# Patient Record
Sex: Female | Born: 1935 | Race: White | Hispanic: No | Marital: Single | Smoking: Never smoker
Health system: Southern US, Community
[De-identification: ages and names within clinical notes are randomized; demographics above are authoritative.]

## PROBLEM LIST (undated history)

## (undated) DIAGNOSIS — S72009A Fracture of unspecified part of neck of unspecified femur, initial encounter for closed fracture: Secondary | ICD-10-CM

## (undated) DIAGNOSIS — K635 Polyp of colon: Secondary | ICD-10-CM

## (undated) DIAGNOSIS — R7301 Impaired fasting glucose: Secondary | ICD-10-CM

## (undated) DIAGNOSIS — K8020 Calculus of gallbladder without cholecystitis without obstruction: Secondary | ICD-10-CM

## (undated) DIAGNOSIS — H409 Unspecified glaucoma: Secondary | ICD-10-CM

## (undated) DIAGNOSIS — K449 Diaphragmatic hernia without obstruction or gangrene: Secondary | ICD-10-CM

## (undated) DIAGNOSIS — E785 Hyperlipidemia, unspecified: Secondary | ICD-10-CM

## (undated) DIAGNOSIS — D649 Anemia, unspecified: Secondary | ICD-10-CM

## (undated) HISTORY — DX: Unspecified glaucoma: H40.9

## (undated) HISTORY — DX: Anemia, unspecified: D64.9

## (undated) HISTORY — PX: ABDOMINAL HYSTERECTOMY: SHX81

## (undated) HISTORY — DX: Polyp of colon: K63.5

## (undated) HISTORY — PX: CHOLECYSTECTOMY: SHX55

## (undated) HISTORY — DX: Calculus of gallbladder without cholecystitis without obstruction: K80.20

## (undated) HISTORY — DX: Impaired fasting glucose: R73.01

## (undated) HISTORY — DX: Fracture of unspecified part of neck of unspecified femur, initial encounter for closed fracture: S72.009A

## (undated) HISTORY — PX: JOINT REPLACEMENT: SHX530

## (undated) HISTORY — DX: Diaphragmatic hernia without obstruction or gangrene: K44.9

## (undated) HISTORY — DX: Hyperlipidemia, unspecified: E78.5

---

## 2007-05-27 LAB — HM COLONOSCOPY

## 2008-07-09 LAB — HM PAP SMEAR

## 2009-07-09 ENCOUNTER — Encounter (INDEPENDENT_AMBULATORY_CARE_PROVIDER_SITE_OTHER): Payer: Self-pay | Admitting: Ophthalmology

## 2009-07-09 ENCOUNTER — Observation Stay (HOSPITAL_COMMUNITY): Admission: RE | Admit: 2009-07-09 | Discharge: 2009-07-10 | Payer: Self-pay | Admitting: Ophthalmology

## 2010-04-14 LAB — CBC
HCT: 40.3 % (ref 36.0–46.0)
Platelets: 213 10*3/uL (ref 150–400)
RDW: 12.6 % (ref 11.5–15.5)

## 2011-07-09 LAB — HM MAMMOGRAPHY

## 2011-11-30 IMAGING — CR DG CHEST 2V
2 series · 2 of 2 positions shown · non-contrast
Comparison: None.

CLINICAL DATA: Dislocated IOL right eye.  Preop workup.

CHEST - 2 VIEW

[view not recorded (1 of 2)]
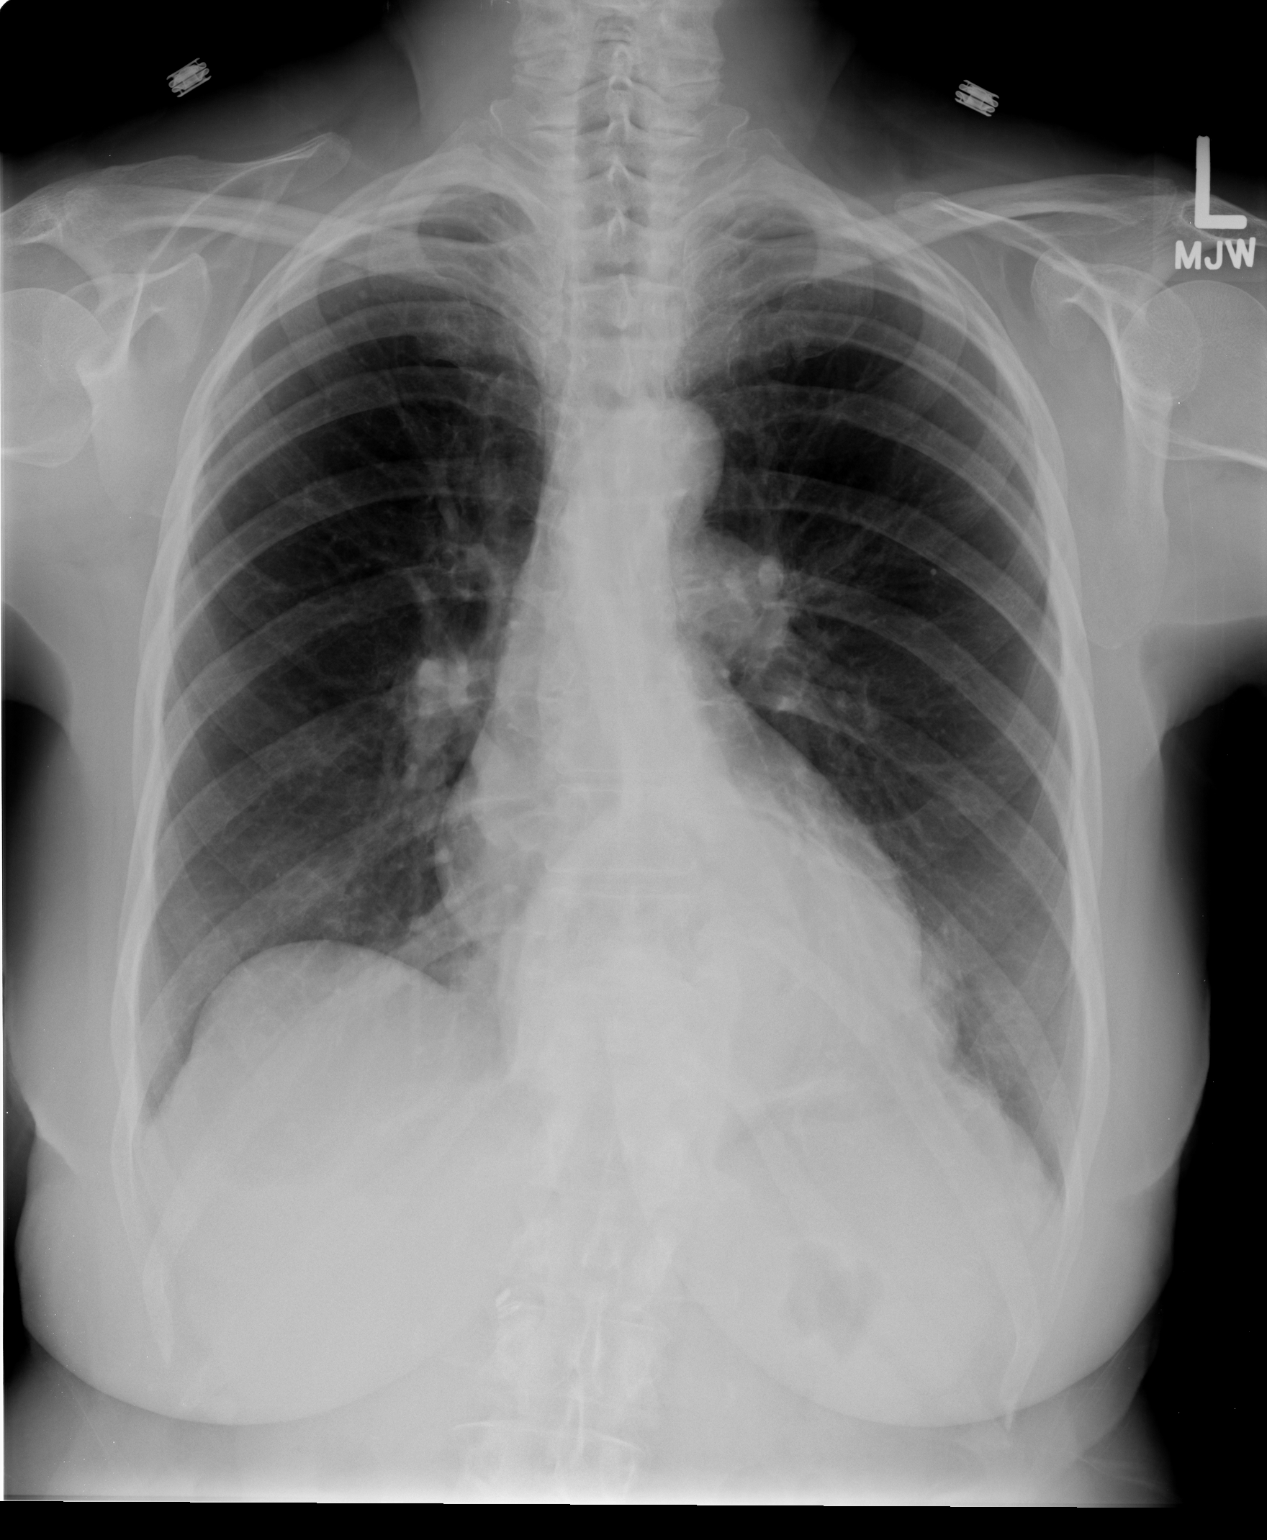

[view not recorded (2 of 2)]
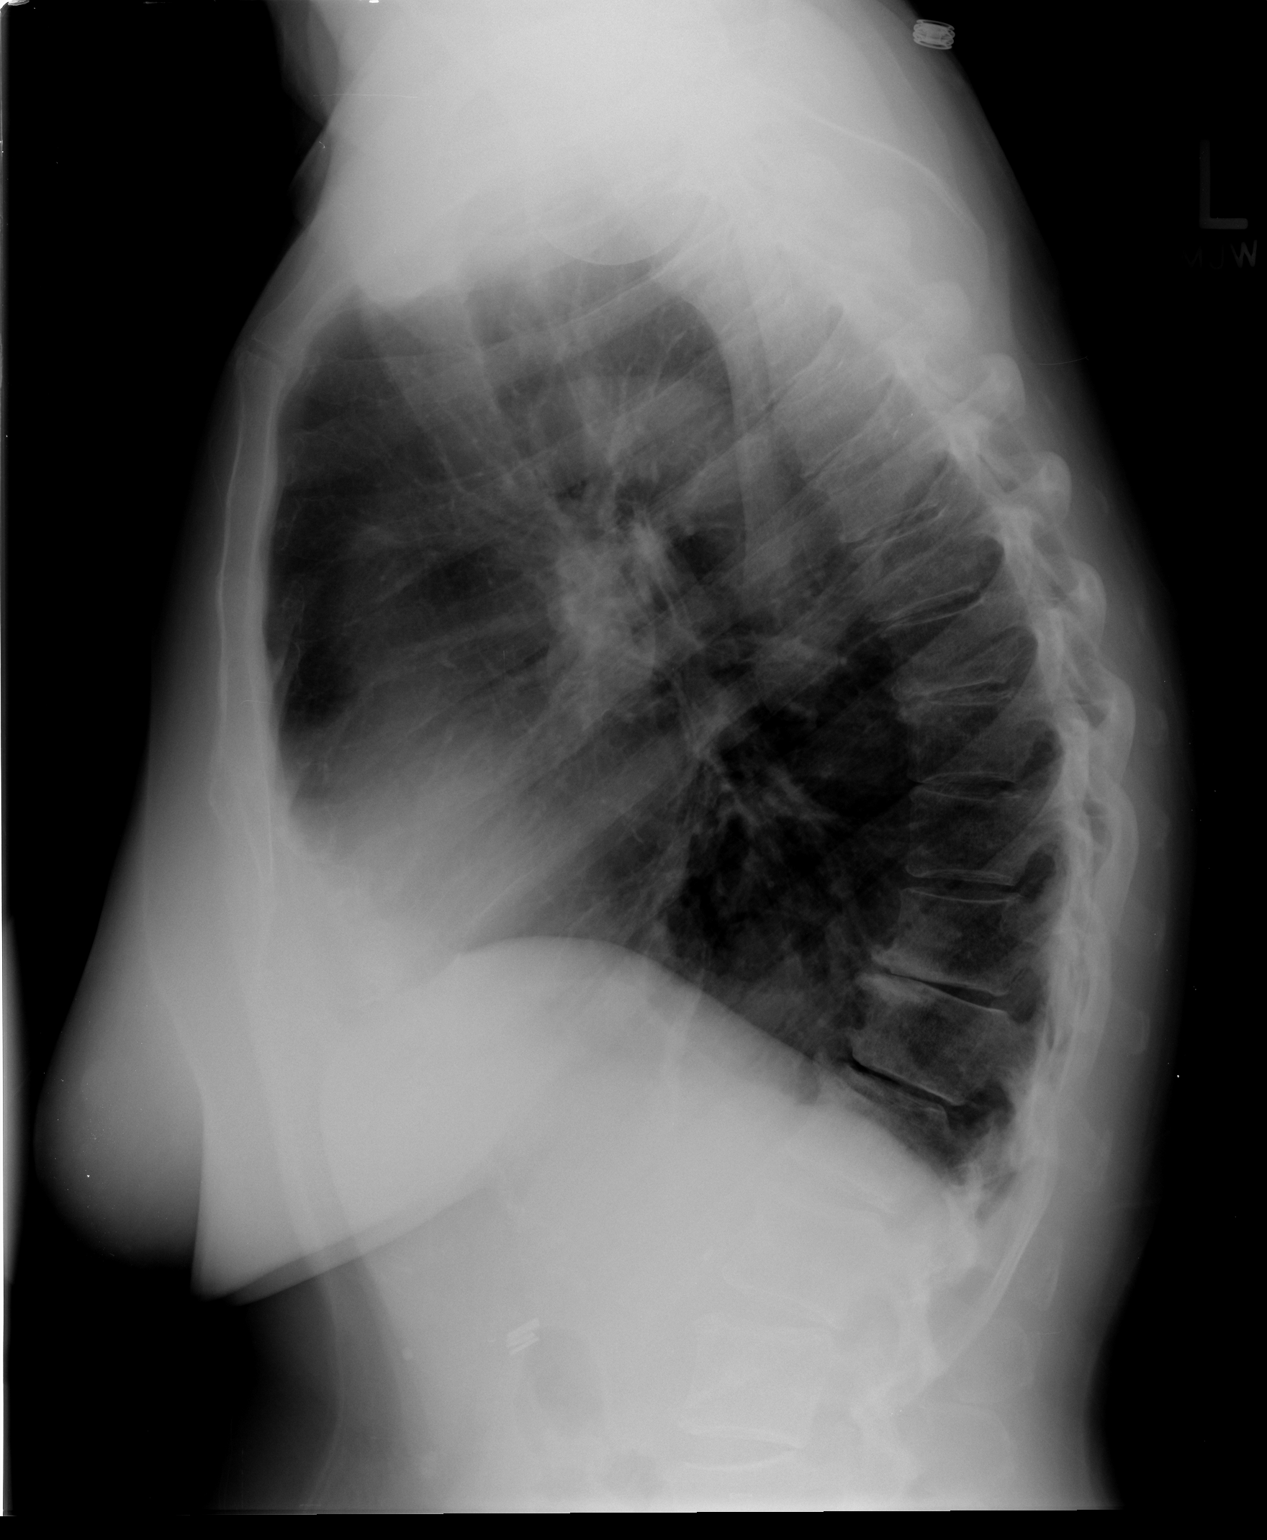

[2 of 2 positions shown; findings below may reference images not displayed]

FINDINGS: Normal heart size.  Hiatal hernia.  Lungs clear of an
active process.  Spondylosis of the lower dorsal spine
IMPRESSION: No acute chest findings.  Hiatal hernia.

## 2012-05-26 ENCOUNTER — Other Ambulatory Visit: Payer: Self-pay | Admitting: Family Medicine

## 2012-05-26 ENCOUNTER — Other Ambulatory Visit: Payer: Medicare Other

## 2012-05-26 LAB — T4, FREE: Free T4: 1.03 ng/dL (ref 0.80–1.80)

## 2012-05-26 LAB — COMPLETE METABOLIC PANEL WITH GFR
ALT: 18 U/L (ref 0–35)
AST: 20 U/L (ref 0–37)
Albumin: 4.3 g/dL (ref 3.5–5.2)
Alkaline Phosphatase: 74 U/L (ref 39–117)
BUN: 24 mg/dL — ABNORMAL HIGH (ref 6–23)
CO2: 23 mEq/L (ref 19–32)
Calcium: 9.8 mg/dL (ref 8.4–10.5)
Chloride: 103 mEq/L (ref 96–112)
Creat: 0.87 mg/dL (ref 0.50–1.10)
GFR, Est African American: 72 mL/min
GFR, Est Non African American: 62 mL/min
Glucose, Bld: 108 mg/dL — ABNORMAL HIGH (ref 70–99)
Potassium: 4.5 mEq/L (ref 3.5–5.3)
Sodium: 140 mEq/L (ref 135–145)
Total Bilirubin: 0.8 mg/dL (ref 0.3–1.2)
Total Protein: 6.4 g/dL (ref 6.0–8.3)

## 2012-05-26 LAB — HEMOGLOBIN A1C
Hgb A1c MFr Bld: 5.6 % (ref <5.7)
Mean Plasma Glucose: 114 mg/dL (ref <117)

## 2012-05-26 LAB — TSH: TSH: 9.933 u[IU]/mL — ABNORMAL HIGH (ref 0.350–4.500)

## 2012-05-27 LAB — MICROALBUMIN, URINE: Microalb, Ur: 0.63 mg/dL (ref 0.00–1.89)

## 2012-06-03 ENCOUNTER — Ambulatory Visit (INDEPENDENT_AMBULATORY_CARE_PROVIDER_SITE_OTHER): Payer: Medicare Other | Admitting: Family Medicine

## 2012-06-03 ENCOUNTER — Encounter: Payer: Self-pay | Admitting: Family Medicine

## 2012-06-03 VITALS — BP 107/70 | HR 82 | Ht 61.0 in | Wt 153.0 lb

## 2012-06-03 DIAGNOSIS — E785 Hyperlipidemia, unspecified: Secondary | ICD-10-CM

## 2012-06-03 DIAGNOSIS — R413 Other amnesia: Secondary | ICD-10-CM

## 2012-06-03 DIAGNOSIS — I10 Essential (primary) hypertension: Secondary | ICD-10-CM

## 2012-06-03 DIAGNOSIS — E039 Hypothyroidism, unspecified: Secondary | ICD-10-CM

## 2012-06-03 MED ORDER — VITAMIN D (ERGOCALCIFEROL) 1.25 MG (50000 UNIT) PO CAPS: 50000.0000 [IU] | ORAL_CAPSULE | ORAL | Status: AC

## 2012-06-03 MED ORDER — SIMVASTATIN 80 MG PO TABS: 80.0000 mg | ORAL_TABLET | Freq: Every day | ORAL | Status: DC

## 2012-06-03 MED ORDER — LOSARTAN POTASSIUM-HCTZ 100-12.5 MG PO TABS: 1.0000 | ORAL_TABLET | Freq: Every day | ORAL | Status: DC

## 2012-06-03 MED ORDER — FOLIC ACID 1 MG PO TABS: 1.0000 mg | ORAL_TABLET | Freq: Every day | ORAL | Status: AC

## 2012-06-03 MED ORDER — LEVOTHYROXINE SODIUM 75 MCG PO TABS: 75.0000 ug | ORAL_TABLET | Freq: Every day | ORAL | Status: DC

## 2012-06-03 NOTE — Patient Instructions (Addendum)
1)  Fatigue - Start on the levothyroxine 75 mcg every morning.  Start taking your Zyrtec at night.  If you need more help with allergies then try taking one Zyrtec D during the day. The "D" will counteract the sleepiness.  Cut your BP medication in 1/2.  If you aren't feeling less fatigued in 3 months, then we could try adding some Wellbutrin.   Fatigue Fatigue is a feeling of tiredness, lack of energy, lack of motivation, or feeling tired all the time. Having enough rest, good nutrition, and reducing stress will normally reduce fatigue. Consult your caregiver if it persists. The nature of your fatigue will help your caregiver to find out its cause. The treatment is based on the cause.  CAUSES  There are many causes for fatigue. Most of the time, fatigue can be traced to one or more of your habits or routines. Most causes fit into one or more of three general areas. They are: Lifestyle problems  Sleep disturbances.  Overwork.  Physical exertion.  Unhealthy habits.  Poor eating habits or eating disorders.  Alcohol and/or drug use .  Lack of proper nutrition (malnutrition). Psychological problems  Stress and/or anxiety problems.  Depression.  Grief.  Boredom. Medical Problems or Conditions  Anemia.  Pregnancy.  Thyroid gland problems.  Recovery from major surgery.  Continuous pain.  Emphysema or asthma that is not well controlled  Allergic conditions.  Diabetes.  Infections (such as mononucleosis).  Obesity.  Sleep disorders, such as sleep apnea.  Heart failure or other heart-related problems.  Cancer.  Kidney disease.  Liver disease.  Effects of certain medicines such as antihistamines, cough and cold remedies, prescription pain medicines, heart and blood pressure medicines, drugs used for treatment of cancer, and some antidepressants. SYMPTOMS  The symptoms of fatigue include:   Lack of energy.  Lack of drive (motivation).  Drowsiness.  Feeling  of indifference to the surroundings. DIAGNOSIS  The details of how you feel help guide your caregiver in finding out what is causing the fatigue. You will be asked about your present and past health condition. It is important to review all medicines that you take, including prescription and non-prescription items. A thorough exam will be done. You will be questioned about your feelings, habits, and normal lifestyle. Your caregiver may suggest blood tests, urine tests, or other tests to look for common medical causes of fatigue.  TREATMENT  Fatigue is treated by correcting the underlying cause. For example, if you have continuous pain or depression, treating these causes will improve how you feel. Similarly, adjusting the dose of certain medicines will help in reducing fatigue.  HOME CARE INSTRUCTIONS   Try to get the required amount of good sleep every night.  Eat a healthy and nutritious diet, and drink enough water throughout the day.  Practice ways of relaxing (including yoga or meditation).  Exercise regularly.  Make plans to change situations that cause stress. Act on those plans so that stresses decrease over time. Keep your work and personal routine reasonable.  Avoid street drugs and minimize use of alcohol.  Start taking a daily multivitamin after consulting your caregiver. SEEK MEDICAL CARE IF:   You have persistent tiredness, which cannot be accounted for.  You have fever.  You have unintentional weight loss.  You have headaches.  You have disturbed sleep throughout the night.  You are feeling sad.  You have constipation.  You have dry skin.  You have gained weight.  You are taking any  new or different medicines that you suspect are causing fatigue.  You are unable to sleep at night.  You develop any unusual swelling of your legs or other parts of your body. SEEK IMMEDIATE MEDICAL CARE IF:   You are feeling confused.  Your vision is blurred.  You feel  faint or pass out.  You develop severe headache.  You develop severe abdominal, pelvic, or back pain.  You develop chest pain, shortness of breath, or an irregular or fast heartbeat.  You are unable to pass a normal amount of urine.  You develop abnormal bleeding such as bleeding from the rectum or you vomit blood.  You have thoughts about harming yourself or committing suicide.  You are worried that you might harm someone else. MAKE SURE YOU:   Understand these instructions.  Will watch your condition.  Will get help right away if you are not doing well or get worse. Document Released: 11/09/2006 Document Revised: 04/06/2011 Document Reviewed: 11/09/2006 [facility] Patient Information 2013 [place], [place]. Vitamin B12 and Folate, Vitamin B12, Cobalamin This is a blood test used to determine the cause of anemia or neuropathy (nerve damage). It is also used to check nutritional status in some patients. It is also used to monitor effectiveness of treatment for B12 or folate deficiency. These tests measure the concentration of folate and vitamin B12 in the serum (liquid portion of the blood). The amount of folate inside the red blood cell (RBC) may also be measured. B12 and folate are both part of the B complex of vitamins and come from food. Folate is found in leafy green vegetables, citrus fruits, dry beans and peas, liver, and yeast; while B12 is found in animal products such as red meat, fish, poultry, milk, and eggs. Fortified cereals, breads, and other grain products are now also important dietary sources of both B12 and folate (identified as "folic acid" on nutritional labels), especially for those vegetarians who do not consume any animal products.  Both B12 and folate are necessary for normal RBC formation, tissue and cellular repair, and DNA synthesis. B12 is also important for nerve health, while folate is necessary for cell division such as is seen in a fetus during pregnancy. A  deficiency in either B12 or folate can lead to a form of anemia characterized by the production of fewer, but larger, RBC's (called macrocytes). A deficiency in B12 can also result in varying degrees of neuropathy, nerve damage that can cause tingling and numbness in the patient's hands and feet. A deficiency in folate can cause neural tube defects such as spina bifida in a growing fetus.  If a patient is deficient in both B12 and folate, but only takes folic acid supplements, the B12 deficiency may be masked. The anemia associated with both may be resolved, but the underlying neuropathy (nerve damage) will persist.  The Schilling test was once ordered fairly routinely to confirm a diagnosis of pernicious anemia as the cause of a B12 deficiency. It is still ordered occasionally but has fallen from favor because it involves the administration of radioactive B12. The Schilling test has been replaced, in part, by the measurement of intrinsic factor binding antibodies and parietal cell antibodies. PREPARATION FOR TEST A blood sample is obtained by inserting a needle into a vein in the arm. No special preparation is necessary. NORMAL FINDINGS 160-050 pg/mL (SI units) Ranges for normal findings may vary among different laboratories and hospitals. You should always check with your doctor after having lab work or  other tests done to discuss the meaning of your test results and whether your values are considered within normal limits. MEANING OF TEST  Your caregiver will go over the test results with you and discuss the importance and meaning of your results, as well as treatment options and the need for additional tests if necessary. OBTAINING THE TEST RESULTS It is your responsibility to obtain your test results. Ask the lab or department performing the test when and how you will get your results. Document Released: 02/05/2004 Document Revised: 04/06/2011 Document Reviewed: 12/25/2007 [facility] Patient  Information 2013 [place], [place].

## 2012-06-03 NOTE — Progress Notes (Signed)
  Subjective:    Patient ID: Katie Mays, female    DOB: 07/04/1927, 79 y.o.   MRN: [id number]  HPI Katie Mays is here today with her daughter to go over her most recent lab results and to discuss a few issues.    1) Leg Pain:  She has noticed some cramps and left leg numbness.  She said this problem has been going off and on for about six months. She feels that these cramps feel better when she walks.   2)  Fatigue:  She continues to struggle with fatigue.  She never filled her Synthroid.      Review of Systems  Constitutional: Positive for fatigue. Negative for activity change and unexpected weight change.  HENT: Negative.   Respiratory: Negative.   Cardiovascular: Negative.   Gastrointestinal: Negative.   Genitourinary: Negative.   Musculoskeletal: Positive for myalgias.  Neurological: Positive for numbness.       Objective:   Physical Exam  Constitutional: She appears well-nourished. No distress.  HENT:  Head: Normocephalic.  Eyes: No scleral icterus.  Neck: No thyromegaly present.  Cardiovascular: Normal rate, regular rhythm and normal heart sounds.   Pulmonary/Chest: Effort normal and breath sounds normal.  Abdominal: There is no tenderness.  Musculoskeletal: She exhibits tenderness (Mild ). She exhibits no edema.  Neurological: She is alert.  Skin: Skin is warm and dry.  Psychiatric: She has a normal mood and affect. Her behavior is normal. Judgment and thought content normal.          Assessment & Plan:

## 2012-06-13 DIAGNOSIS — E785 Hyperlipidemia, unspecified: Secondary | ICD-10-CM | POA: Insufficient documentation

## 2012-06-13 DIAGNOSIS — R413 Other amnesia: Secondary | ICD-10-CM | POA: Insufficient documentation

## 2012-06-13 DIAGNOSIS — I10 Essential (primary) hypertension: Secondary | ICD-10-CM | POA: Insufficient documentation

## 2012-06-13 DIAGNOSIS — E039 Hypothyroidism, unspecified: Secondary | ICD-10-CM | POA: Insufficient documentation

## 2012-06-13 NOTE — Assessment & Plan Note (Signed)
Refilled her losartan/HCT.

## 2012-06-13 NOTE — Assessment & Plan Note (Signed)
She is to try some Folic Acid.

## 2012-06-13 NOTE — Assessment & Plan Note (Signed)
She is going to start taking the levothyroxine.

## 2012-06-13 NOTE — Assessment & Plan Note (Signed)
Refilled her Zocor.

## 2012-08-25 ENCOUNTER — Other Ambulatory Visit: Payer: Self-pay | Admitting: *Deleted

## 2012-08-25 DIAGNOSIS — E785 Hyperlipidemia, unspecified: Secondary | ICD-10-CM

## 2012-08-25 DIAGNOSIS — E039 Hypothyroidism, unspecified: Secondary | ICD-10-CM

## 2012-08-29 ENCOUNTER — Other Ambulatory Visit: Payer: Medicare Other

## 2012-08-29 LAB — LIPID PANEL
Cholesterol: 181 mg/dL (ref 0–200)
HDL: 66 mg/dL (ref >39)
LDL Cholesterol: 89 mg/dL (ref 0–99)
Total CHOL/HDL Ratio: 2.7 Ratio
Triglycerides: 131 mg/dL (ref <150)
VLDL: 26 mg/dL (ref 0–40)

## 2012-08-29 LAB — COMPLETE METABOLIC PANEL WITH GFR
ALT: 14 U/L (ref 0–35)
AST: 18 U/L (ref 0–37)
Albumin: 4.4 g/dL (ref 3.5–5.2)
Alkaline Phosphatase: 62 U/L (ref 39–117)
BUN: 23 mg/dL (ref 6–23)
CO2: 25 mEq/L (ref 19–32)
Calcium: 9.7 mg/dL (ref 8.4–10.5)
Chloride: 105 mEq/L (ref 96–112)
Creat: 0.86 mg/dL (ref 0.50–1.10)
GFR, Est African American: 73 mL/min
GFR, Est Non African American: 63 mL/min
Glucose, Bld: 94 mg/dL (ref 70–99)
Potassium: 4.1 mEq/L (ref 3.5–5.3)
Sodium: 140 mEq/L (ref 135–145)
Total Bilirubin: 0.8 mg/dL (ref 0.3–1.2)
Total Protein: 6.2 g/dL (ref 6.0–8.3)

## 2012-08-29 LAB — T4, FREE: Free T4: 1.41 ng/dL (ref 0.80–1.80)

## 2012-08-29 LAB — TSH: TSH: 3.634 u[IU]/mL (ref 0.350–4.500)

## 2012-09-05 ENCOUNTER — Encounter: Payer: Self-pay | Admitting: Family Medicine

## 2012-09-05 ENCOUNTER — Ambulatory Visit (INDEPENDENT_AMBULATORY_CARE_PROVIDER_SITE_OTHER): Payer: Medicare Other | Admitting: Family Medicine

## 2012-09-05 VITALS — BP 123/73 | HR 92 | Resp 16 | Ht 61.0 in | Wt 155.0 lb

## 2012-09-05 DIAGNOSIS — R413 Other amnesia: Secondary | ICD-10-CM

## 2012-09-05 DIAGNOSIS — E785 Hyperlipidemia, unspecified: Secondary | ICD-10-CM

## 2012-09-05 DIAGNOSIS — E039 Hypothyroidism, unspecified: Secondary | ICD-10-CM

## 2012-09-05 DIAGNOSIS — I10 Essential (primary) hypertension: Secondary | ICD-10-CM

## 2012-09-05 MED ORDER — SIMVASTATIN 80 MG PO TABS: 80.0000 mg | ORAL_TABLET | Freq: Every day | ORAL | Status: AC

## 2012-09-05 MED ORDER — LEVOTHYROXINE SODIUM 100 MCG PO TABS: 100.0000 ug | ORAL_TABLET | Freq: Every day | ORAL | Status: AC

## 2012-09-05 MED ORDER — LOSARTAN POTASSIUM-HCTZ 100-12.5 MG PO TABS: 1.0000 | ORAL_TABLET | Freq: Every day | ORAL | Status: AC

## 2012-09-05 NOTE — Patient Instructions (Addendum)
1)  Hypertension - Your BP is perfect so stay on 1/2 of the Losartan HCT (100/12.5).  2)  Cholesterol - Your lipid panel is also perfect so stay on 1/2 of the Simvastatin (80 mg).    3)  Thyroid - We have a little room to play with so let's try increasing you to 100 mcg daily.  To use up what you have (75 mcg) take 1 pill daily except for M/W/F take 2 pills then they are gone then you'll do to the 100 mcg daily.  We'll reheck your level in 3 months to see if we can keep you at this dosage or if we need to lower it to 88 mcg.  You don't have to be fasting for those labs.      Hypothyroidism The thyroid is a large gland located in the lower front of your neck. The thyroid gland helps control metabolism. Metabolism is how your body handles food. It controls metabolism with the hormone thyroxine. When this gland is underactive (hypothyroid), it produces too little hormone.  CAUSES These include:   Absence or destruction of thyroid tissue.  Goiter due to iodine deficiency.  Goiter due to medications.  Congenital defects (since birth).  Problems with the pituitary. This causes a lack of TSH (thyroid stimulating hormone). This hormone tells the thyroid to turn out more hormone. SYMPTOMS  Lethargy (feeling as though you have no energy)  Cold intolerance  Weight gain (in spite of normal food intake)  Dry skin  Coarse hair  Menstrual irregularity (if severe, may lead to infertility)  Slowing of thought processes Cardiac problems are also caused by insufficient amounts of thyroid hormone. Hypothyroidism in the newborn is cretinism, and is an extreme form. It is important that this form be treated adequately and immediately or it will lead rapidly to retarded physical and mental development. DIAGNOSIS  To prove hypothyroidism, your caregiver may do blood tests and ultrasound tests. Sometimes the signs are hidden. It may be necessary for your caregiver to watch this illness with blood tests  either before or after diagnosis and treatment. TREATMENT  Low levels of thyroid hormone are increased by using synthetic thyroid hormone. This is a safe, effective treatment. It usually takes about four weeks to gain the full effects of the medication. After you have the full effect of the medication, it will generally take another four weeks for problems to leave. Your caregiver may start you on low doses. If you have had heart problems the dose may be gradually increased. It is generally not an emergency to get rapidly to normal. HOME CARE INSTRUCTIONS   Take your medications as your caregiver suggests. Let your caregiver know of any medications you are taking or start taking. Your caregiver will help you with dosage schedules.  As your condition improves, your dosage needs may increase. It will be necessary to have continuing blood tests as suggested by your caregiver.  Report all suspected medication side effects to your caregiver. SEEK MEDICAL CARE IF: Seek medical care if you develop:  Sweating.  Tremulousness (tremors).  Anxiety.  Rapid weight loss.  Heat intolerance.  Emotional swings.  Diarrhea.  Weakness. SEEK IMMEDIATE MEDICAL CARE IF:  You develop chest pain, an irregular heart beat (palpitations), or a rapid heart beat. MAKE SURE YOU:   Understand these instructions.  Will watch your condition.  Will get help right away if you are not doing well or get worse. Document Released: 01/12/2005 Document Revised: 04/06/2011 Document Reviewed: 09/02/2007  [employer] Patient Information 2014 [employer].  

## 2012-09-05 NOTE — Progress Notes (Deleted)
  Subjective:    Patient ID: Katie Mays, female    DOB: 1927-04-29, 79 y.o.   MRN: [id number]  HPI    Review of Systems     Objective:   Physical Exam        Assessment & Plan:

## 2012-09-05 NOTE — Progress Notes (Signed)
  Subjective:    Patient ID: Katie Mays, female    DOB: 03-Apr-1927, 79 y.o.   MRN: [id number]  HPI  [name] is here today to discuss her recent labs and to follow up/get refills for the following conditions:     1)  Hyperlipidemia: She is doing well on the simvastatin.  She takes half a tablet daily (40 mg) without any complaint.    2)  Hypothyroidism: She is doing well on the Levothyroxin 75 mcg daily.    3)  Hypertension: Her BP is well controlled on the Losartan/HCT (1/2 of the 100/12.5).   She keeps check of her BP at home.    4)  Vitamin D:  She is taking 50,000 IU of Vitamin D weekly.    5)  Memory:  She is taking 1 mg of Folic Acid daily.  She thinks that it may me helping her some.     Review of Systems  Constitutional: Negative for fatigue.       Her fatigue has improved since her last visit.  Respiratory: Negative.   Cardiovascular: Negative.   Skin: Negative.   Neurological: Positive for light-headedness. Negative for headaches.       She gets light headed when she stands up from time to time.   Psychiatric/Behavioral: Negative for sleep disturbance.       Her sleep has improved since her last visit.    Past Medical History  Diagnosis Date  . Hyperlipidemia   . Anemia   . Colon polyps   . Hiatal hernia   . Glaucoma   . Hip fracture   . IFG (impaired fasting glucose)     Family History  Problem Relation Age of Onset  . Hypertension Mother   . Diabetes Father   . Leukemia Father   . Leukemia Sister   . 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     Education: Engineer, agricultural   Tobacco Use/Exposure:  None   Alcohol Use:  None   Drug Use: None   Diet: Regular   Exercise: None   Hobbies:  Gardening, Knitting, Cross-Word Puzzles        Objective:   Physical Exam  Vitals reviewed. Constitutional: She is oriented to person, place, and time.  Eyes: Conjunctivae are normal. No scleral icterus.  Neck: Neck supple. No thyromegaly present.  Cardiovascular: Normal rate, regular rhythm and normal heart sounds.   Pulmonary/Chest: Effort normal and breath sounds normal.  Musculoskeletal: She exhibits no edema and no tenderness.  Lymphadenopathy:    She has no cervical adenopathy.  Neurological: She is alert and oriented to person, place, and time.  Skin: Skin is warm and dry.  Psychiatric: She has a normal mood and affect. Her behavior is normal. Judgment and thought content normal.          Assessment & Plan:

## 2012-09-30 ENCOUNTER — Encounter: Payer: Self-pay | Admitting: Family Medicine

## 2012-09-30 ENCOUNTER — Ambulatory Visit (INDEPENDENT_AMBULATORY_CARE_PROVIDER_SITE_OTHER): Payer: Medicare Other | Admitting: Family Medicine

## 2012-09-30 VITALS — BP 118/75 | HR 80 | Resp 16 | Ht 61.0 in | Wt 154.0 lb

## 2012-09-30 DIAGNOSIS — Z0000 Encounter for general adult medical examination without abnormal findings: Secondary | ICD-10-CM

## 2012-09-30 DIAGNOSIS — Z23 Encounter for immunization: Secondary | ICD-10-CM

## 2012-09-30 NOTE — Progress Notes (Signed)
Subjective:    Patient ID: Katie Mays, female    DOB: [birth year], 79 y.o.   MRN: [id number]  HPI  Katie Mays is here today for her annual CPE.  She has done well since her last office visit.        Review of Systems  Constitutional: Negative.   HENT: Negative.   Eyes: Negative.   Respiratory: Negative.   Cardiovascular: Negative.   Gastrointestinal: Negative.   Endocrine: Negative.   Genitourinary: Negative.   Musculoskeletal: Negative.   Skin: Negative.   Allergic/Immunologic: Negative.   Neurological: Negative.   Hematological: Negative.   Psychiatric/Behavioral: Negative.     Past Medical History  Diagnosis Date  . Hyperlipidemia   . Anemia   . Colon polyps   . Hiatal hernia   . Glaucoma   . Hip fracture   . IFG (impaired fasting glucose)   . Cholelithiases     Past Surgical History  Procedure Laterality Date  . Joint replacement Left     Dr Dimas Aguas  . Cholecystectomy    . Abdominal hysterectomy       Family History  Problem Relation Age of Onset  . Hypertension Mother   . Diabetes Father   . Leukemia Father   . Leukemia Sister   . 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     Education: Engineer, agricultural   Tobacco Use/Exposure:  None   Alcohol Use:  None   Drug Use: None   Diet: Regular   Exercise: None   Hobbies:  Gardening, Knitting, Cross-Word Puzzles       Objective:   Physical Exam  Vitals reviewed. Constitutional: She is oriented to person, place, and time. She appears well-developed and well-nourished.  HENT:  Head: Normocephalic and atraumatic.  Right Ear: External ear normal.  Left Ear: External ear normal.  Nose: Nose normal.  Mouth/Throat: Oropharynx is clear and moist.  Eyes: Conjunctivae and EOM are normal. Pupils  are equal, round, and reactive to light.  Neck: Normal range of motion. No thyromegaly present.  Cardiovascular: Normal rate, regular rhythm, normal heart sounds and intact distal pulses.  Exam reveals no gallop and no friction rub.   No murmur heard. Pulmonary/Chest: Effort normal and breath sounds normal. Right breast exhibits no inverted nipple, no mass, no nipple discharge, no skin change and no tenderness. Left breast exhibits no inverted nipple, no mass, no nipple discharge, no skin change and no tenderness. Breasts are symmetrical.  Abdominal: Soft. Bowel sounds are normal. Hernia confirmed negative in the right inguinal area and confirmed negative in the left inguinal area.  Genitourinary: Vagina normal. Pelvic exam was performed with patient supine. There is no rash, tenderness or lesion on the right labia. There is no rash, tenderness or lesion on the left labia. No vaginal discharge found.  Musculoskeletal: Normal range of motion. She exhibits no edema and no tenderness.  Lymphadenopathy:    She has no cervical adenopathy.       Right: No inguinal adenopathy present.       Left: No inguinal adenopathy present.  Neurological: She is alert and oriented to person, place, and time. She has normal reflexes.  Skin: Skin is warm and dry.  Psychiatric: She has a normal mood  and affect. Her behavior is normal. Judgment and thought content normal.          Assessment & Plan:

## 2012-10-22 NOTE — Assessment & Plan Note (Signed)
Her lipid panel is perfect on 40 mg of simvastatin.  She was given a refill for 80 mg.

## 2012-10-22 NOTE — Assessment & Plan Note (Addendum)
Her BP is great on her current dosage of losartan Hct 50/6.25.  She was given a refill for the 100/12.5.

## 2012-10-22 NOTE — Assessment & Plan Note (Signed)
She will continue on the Folic Acid.

## 2012-10-22 NOTE — Assessment & Plan Note (Signed)
She is to finish up her levothyroxine 75 mcg and we'll increase her to 100 mcg.  She is to return for a recheck of her labs in 3 months.

## 2012-12-03 ENCOUNTER — Encounter: Payer: Self-pay | Admitting: Family Medicine

## 2012-12-03 DIAGNOSIS — Z0000 Encounter for general adult medical examination without abnormal findings: Secondary | ICD-10-CM | POA: Insufficient documentation

## 2012-12-03 DIAGNOSIS — Z23 Encounter for immunization: Secondary | ICD-10-CM | POA: Insufficient documentation

## 2012-12-03 NOTE — Assessment & Plan Note (Signed)
The patient confirmed that they are not allergic to eggs and have never had a bad reaction with the flu shot in the past.  The vaccination was given without difficulty.   

## 2012-12-03 NOTE — Addendum Note (Signed)
Addended by: Birdena Jubilee on: 12/03/2012 10:47 PM   Modules accepted: Level of Service

## 2012-12-03 NOTE — Assessment & Plan Note (Signed)
Normal exam; we discussed preventative issues for her age.  

## 2012-12-05 ENCOUNTER — Other Ambulatory Visit: Payer: Self-pay | Admitting: *Deleted

## 2012-12-05 DIAGNOSIS — E039 Hypothyroidism, unspecified: Secondary | ICD-10-CM

## 2012-12-06 ENCOUNTER — Other Ambulatory Visit: Payer: Medicare Other

## 2012-12-12 ENCOUNTER — Other Ambulatory Visit: Payer: Medicare Other | Admitting: Family Medicine

## 2012-12-13 ENCOUNTER — Other Ambulatory Visit: Payer: Medicare Other

## 2012-12-13 LAB — T4, FREE: Free T4: 1.63 ng/dL (ref 0.80–1.80)

## 2012-12-13 LAB — TSH: TSH: 0.603 u[IU]/mL (ref 0.350–4.500)

## 2012-12-13 LAB — T3, FREE: T3, Free: 3.2 pg/mL (ref 2.3–4.2)

## 2012-12-20 ENCOUNTER — Ambulatory Visit (INDEPENDENT_AMBULATORY_CARE_PROVIDER_SITE_OTHER): Payer: Medicare Other | Admitting: Family Medicine

## 2012-12-20 ENCOUNTER — Encounter: Payer: Medicare Other | Admitting: Family Medicine

## 2012-12-20 ENCOUNTER — Encounter: Payer: Self-pay | Admitting: Family Medicine

## 2012-12-20 VITALS — BP 125/66 | HR 82 | Resp 16 | Wt 156.0 lb

## 2012-12-20 DIAGNOSIS — E039 Hypothyroidism, unspecified: Secondary | ICD-10-CM

## 2012-12-20 DIAGNOSIS — E663 Overweight: Secondary | ICD-10-CM

## 2012-12-20 MED ORDER — PHENDIMETRAZINE TARTRATE 35 MG PO TABS: 1.0000 | ORAL_TABLET | Freq: Three times a day (TID) | ORAL | Status: AC

## 2012-12-20 NOTE — Assessment & Plan Note (Signed)
Her thyroid levels are perfect on the 100 mcg so she'll stay on that dosage.

## 2012-12-20 NOTE — Assessment & Plan Note (Signed)
Katie Mays is about 10 lbs heavier than she wants to be.  She has gained this weight around her midsection and her clothes are tight.  She is going to try one Phendimetrazine daily while she follows a diet like [place] and/or The 3 Day Diet.  She is to get signed up with [employer].

## 2012-12-20 NOTE — Patient Instructions (Signed)
1)  Hypothyroidism - Your levels are perfect on the 100 mcg so stay on this dosage.    2)  Weight -  Try the 3 day diet /[place]; (My Fitness Pal) 156 lbs/61 in (5 ' 1"); Consider using the Silver Sneakers Program ([employer])   Exercise to Lose Weight Exercise and a healthy diet may help you lose weight. Your doctor may suggest specific exercises. EXERCISE IDEAS AND TIPS  Choose low-cost things you enjoy doing, such as walking, bicycling, or exercising to workout videos.  Take stairs instead of the elevator.  Walk during your lunch break.  Park your car further away from work or school.  Go to a gym or an exercise class.  Start with 5 to 10 minutes of exercise each day. Build up to 30 minutes of exercise 4 to 6 days a week.  Wear shoes with good support and comfortable clothes.  Stretch before and after working out.  Work out until you breathe harder and your heart beats faster.  Drink extra water when you exercise.  Do not do so much that you hurt yourself, feel dizzy, or get very short of breath. Exercises that burn about 150 calories:  Running 1  miles in 15 minutes.  Playing volleyball for 45 to 60 minutes.  Washing and waxing a car for 45 to 60 minutes.  Playing touch football for 45 minutes.  Walking 1  miles in 35 minutes.  Pushing a stroller 1  miles in 30 minutes.  Playing basketball for 30 minutes.  Raking leaves for 30 minutes.  Bicycling 5 miles in 30 minutes.  Walking 2 miles in 30 minutes.  Dancing for 30 minutes.  Shoveling snow for 15 minutes.  Swimming laps for 20 minutes.  Walking up stairs for 15 minutes.  Bicycling 4 miles in 15 minutes.  Gardening for 30 to 45 minutes.  Jumping rope for 15 minutes.  Washing windows or floors for 45 to 60 minutes. Document Released: 02/14/2010 Document Revised: 04/06/2011 Document Reviewed: 02/14/2010 [facility] Patient Information 2014 [place], [place].

## 2012-12-20 NOTE — Progress Notes (Signed)
  Subjective:    Patient ID: Katie Mays, female    DOB: [birth year], 79 y.o.   MRN: [id number]  HPI  Katie Mays is here today to discuss her most recent thyroid lab results.  She continues do well on her Levothyroxine 100 mg.  She does not have any medical concerns today except her weight gain.      Review of Systems  Constitutional: Positive for unexpected weight change.  HENT: Negative.   Eyes: Negative.   Respiratory: Negative.   Cardiovascular: Negative.   Gastrointestinal: Negative.   Endocrine: Negative.   Genitourinary: Negative.   Skin: Negative.   Allergic/Immunologic: Negative.   Neurological: Negative.   Hematological: Negative.   Psychiatric/Behavioral: Negative.      Past Medical History  Diagnosis Date  . Hyperlipidemia   . Anemia   . Colon polyps   . Hiatal hernia   . Glaucoma   . Hip fracture   . IFG (impaired fasting glucose)   . Cholelithiases      Family History  Problem Relation Age of Onset  . Hypertension Mother   . Diabetes Father   . Leukemia Father   . Leukemia Sister   . Leukemia Brother      History   Social History Narrative   Marital Status: Divorced   Children:  3   Pets: Dog Theatre manager)   Living Situation:  Lives with son Harvie Heck   Occupation: Retired Nurse, children's)  She works part-time at the [employer].     Education: Engineer, agricultural   Tobacco Use/Exposure:  None   Alcohol Use:  None   Drug Use: None   Diet: Regular   Exercise: None   Hobbies:  Gardening, Knitting, Cross-Word Puzzles       Objective:   Physical Exam  Vitals reviewed. Constitutional: She is oriented to person, place, and time. She appears well-developed and well-nourished.  Cardiovascular: Normal rate and regular rhythm.   Pulmonary/Chest: Effort normal and breath sounds normal.  Neurological: She is alert and oriented to person, place, and time.  Skin: Skin is warm.  Psychiatric: She has a normal mood and affect.      Assessment &  Plan:

## 2013-06-19 ENCOUNTER — Other Ambulatory Visit: Payer: Self-pay | Admitting: Family Medicine

## 2013-06-29 ENCOUNTER — Telehealth: Payer: Self-pay

## 2013-06-29 NOTE — Telephone Encounter (Signed)
Cylee called and she needs a referral to [facility] [phone] she has an appointment today at 3pm
